# Patient Record
Sex: Male | Born: 1995 | Race: Black or African American | Hispanic: No | Marital: Single | Smoking: Never smoker
Health system: Southern US, Community
[De-identification: ages and names within clinical notes are randomized; demographics above are authoritative.]

## PROBLEM LIST (undated history)

## (undated) DIAGNOSIS — I82409 Acute embolism and thrombosis of unspecified deep veins of unspecified lower extremity: Secondary | ICD-10-CM

## (undated) DIAGNOSIS — Z86718 Personal history of other venous thrombosis and embolism: Secondary | ICD-10-CM

## (undated) HISTORY — PX: SHOULDER SURGERY: SHX246

## (undated) HISTORY — PX: ANTERIOR CRUCIATE LIGAMENT REPAIR: SHX115

---

## 2021-01-18 ENCOUNTER — Ambulatory Visit (INDEPENDENT_AMBULATORY_CARE_PROVIDER_SITE_OTHER): Payer: Worker's Compensation

## 2021-01-18 ENCOUNTER — Other Ambulatory Visit: Payer: Self-pay

## 2021-01-18 ENCOUNTER — Encounter: Payer: Self-pay | Admitting: Emergency Medicine

## 2021-01-18 ENCOUNTER — Ambulatory Visit
Admission: EM | Admit: 2021-01-18 | Discharge: 2021-01-18 | Disposition: A | Discharge status: Home / Self Care | Payer: Worker's Compensation | Attending: Family Medicine | Admitting: Family Medicine

## 2021-01-18 DIAGNOSIS — M7918 Myalgia, other site: Secondary | ICD-10-CM

## 2021-01-18 DIAGNOSIS — M79645 Pain in left finger(s): Secondary | ICD-10-CM | POA: Diagnosis not present

## 2021-01-18 HISTORY — DX: Personal history of other venous thrombosis and embolism: Z86.718

## 2021-01-18 MED ORDER — MELOXICAM 15 MG PO TABS: 15.0000 mg | ORAL_TABLET | Freq: Every day | ORAL | 0 refills | Status: DC | PRN

## 2021-01-18 MED ORDER — TRIAMCINOLONE ACETONIDE 0.5 % EX OINT: 1.0000 "application " | TOPICAL_OINTMENT | Freq: Two times a day (BID) | CUTANEOUS | 0 refills | Status: DC
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
## 2021-01-18 NOTE — Discharge Instructions (Signed)
Xray negative.  Medication as needed.  Everything looks good.  Take care  Dr. Adriana Simas
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
## 2021-04-03 ENCOUNTER — Encounter: Payer: Self-pay | Admitting: Emergency Medicine

## 2021-04-03 ENCOUNTER — Other Ambulatory Visit: Payer: Self-pay

## 2021-04-03 ENCOUNTER — Ambulatory Visit
Admission: EM | Admit: 2021-04-03 | Discharge: 2021-04-03 | Disposition: A | Discharge status: Home / Self Care | Payer: BC Managed Care – PPO

## 2021-04-03 DIAGNOSIS — M62838 Other muscle spasm: Secondary | ICD-10-CM | POA: Diagnosis not present

## 2021-04-03 HISTORY — DX: Acute embolism and thrombosis of unspecified deep veins of unspecified lower extremity: I82.409

## 2021-04-03 MED ORDER — DIAZEPAM 5 MG PO TABS: 5.0000 mg | ORAL_TABLET | Freq: Every evening | ORAL | 0 refills | Status: AC

## 2021-04-03 MED ORDER — IBUPROFEN 600 MG PO TABS: 600.0000 mg | ORAL_TABLET | Freq: Four times a day (QID) | ORAL | 0 refills | Status: AC | PRN

## 2021-04-03 MED ORDER — METHOCARBAMOL 500 MG PO TABS: 500.0000 mg | ORAL_TABLET | Freq: Two times a day (BID) | ORAL | 0 refills | Status: AC

## 2021-04-03 NOTE — ED Provider Notes (Signed)
[facility]    CSN: [id number] Arrival date & time: 04/03/21  1226      History   Chief Complaint Chief Complaint  Patient presents with  . Neck Pain    HPI Cody Robles is a 25 y.o. male.   HPI   25 year old male here for evaluation of neck pain.  Patient reports that he developed pain 2 days ago while in the shower and he was shaking his head back-and-forth.  He states that he felt a pull in his neck.  This pulled then later got worse after he slept on the loveseat with his girlfriend.  He has been using moist heat and Tylenol with minimal relief.  He denies numbness, tingling, weakness in his extremities and he denies injury.  Past Medical History:  Diagnosis Date  . DVT (deep venous thrombosis) (HCC)   . Hx of blood clots     There are no problems to display for this patient.   Past Surgical History:  Procedure Laterality Date  . ANTERIOR CRUCIATE LIGAMENT REPAIR    . SHOULDER SURGERY Left        Home Medications    Prior to Admission medications   Medication Sig Start Date End Date Taking? Authorizing Provider  aspirin EC 81 MG tablet Take 81 mg by mouth daily. Swallow whole.   Yes [provider]  diazepam (VALIUM) 5 MG tablet Take 1 tablet (5 mg total) by mouth at bedtime for 5 days. 04/03/21 04/08/21 Yes Becky Augusta, NP  ibuprofen (ADVIL) 600 MG tablet Take 1 tablet (600 mg total) by mouth every 6 (six) hours as needed. 04/03/21  Yes Becky Augusta, NP  methocarbamol (ROBAXIN) 500 MG tablet Take 1 tablet (500 mg total) by mouth 2 (two) times daily. 04/03/21  Yes Becky Augusta, NP    Family History History reviewed. No pertinent family history.  Social History Social History   Tobacco Use  . Smoking status: Never Smoker  . Smokeless tobacco: Never Used  Vaping Use  . Vaping Use: Never used  Substance Use Topics  . Alcohol use: Never  . Drug use: Yes    Types: Marijuana     Allergies   Patient has no known  allergies.   Review of Systems Review of Systems  Constitutional: Negative for activity change, appetite change and fever.  Musculoskeletal: Positive for myalgias and neck pain. Negative for neck stiffness.  Skin: Negative for color change.  Neurological: Negative for weakness.  Hematological: Negative.   Psychiatric/Behavioral: Negative.      Physical Exam Triage Vital Signs ED Triage Vitals  Enc Vitals Group     BP 04/03/21 1257 103/72     Pulse Rate 04/03/21 1257 72     Resp 04/03/21 1257 18     Temp 04/03/21 1257 98.9 F (37.2 C)     Temp Source 04/03/21 1257 Oral     SpO2 04/03/21 1257 99 %     Weight 04/03/21 1255 (!) 300 lb 0.7 oz (136.1 kg)     Height 04/03/21 1255 6' (1.829 m)     Head Circumference --      Peak Flow --      Pain Score 04/03/21 1254 6     Pain Loc --      Pain Edu? --      Excl. in GC? --    No data found.  Updated Vital Signs BP 103/72 (BP Location: Left Arm)   Pulse 72   Temp 98.9 F (  37.2 C) (Oral)   Resp 18   Ht 6' (1.829 m)   Wt (!) 300 lb 0.7 oz (136.1 kg)   SpO2 99%   BMI 40.69 kg/m   Visual Acuity Right Eye Distance:   Left Eye Distance:   Bilateral Distance:    Right Eye Near:   Left Eye Near:    Bilateral Near:     Physical Exam Vitals and nursing note reviewed.  Constitutional:      General: He is not in acute distress.    Appearance: Normal appearance. He is obese. He is not ill-appearing.  HENT:     Head: Normocephalic and atraumatic.  Musculoskeletal:        General: Tenderness present. No swelling or signs of injury. Normal range of motion.  Skin:    General: Skin is warm and dry.     Capillary Refill: Capillary refill takes less than 2 seconds.     Findings: No bruising or erythema.  Neurological:     General: No focal deficit present.     Mental Status: He is alert and oriented to person, place, and time.  Psychiatric:        Mood and Affect: Mood normal.        Behavior: Behavior normal.         Thought Content: Thought content normal.        Judgment: Judgment normal.      UC Treatments / Results  Labs (all labs ordered are listed, but only abnormal results are displayed) Labs Reviewed - No data to display  EKG   Radiology No results found.  Procedures Procedures (including critical care time)  Medications Ordered in UC Medications - No data to display  Initial Impression / Assessment and Plan / UC Course  I have reviewed the triage vital signs and the nursing notes.  Pertinent labs & imaging results that were available during my care of the patient were reviewed by me and considered in my medical decision making (see chart for details).   Is a very pleasant 25 year old male here for evaluation of neck pain that is been going for last 2 days.  Patient reports that the pain is where his right side of his neck meets his shoulder and is greater on the right side than the left side.  Patient states that he does not have any injury that he can remember that is causes but he initially felt some pulling when he was shaking his head in the shower.  Patient denies numbness, tingling, or weakness in his extremities.  Physical exam reveals no midline cervical spinous tenderness.  Patient does have left and right paraspinous tenderness and spasm which is more significant on the right than the left.  Patient's upper extremity strength is 5/5 bilaterally and bilateral grips are 5/5.  Patient has approximately 45 degrees of lateral rotation in both directions in his neck as well as 30 degrees of lateral flexion.  Patient can extend his neck and look at the ceiling but causes pain when he tries to put his chin to his chest.  We will treat patient for cervicalgia with ibuprofen, methocarbamol, and diazepam for bedtime spasm relief.   Final Clinical Impressions(s) / UC Diagnoses   Final diagnoses:  Trapezius muscle spasm     Discharge Instructions     Take the ibuprofen, 600 mg every  6 hours with food, for pain and inflammation.  Take this on a schedule for the next 2 days and then  as needed.  Take the methocarbamol, 1000 mg every 6 hours with food, for muscle spasm schedule for next 2 days and then as needed.  Use the diazepam at bedtime only as it make you drowsy but will help with the muscle spasm.  Follow the neck range of motion exercises given at discharge.  Consider getting a massage to help with relief of muscle tension and pain as well.    ED Prescriptions    Medication Sig Dispense Auth. Provider   ibuprofen (ADVIL) 600 MG tablet Take 1 tablet (600 mg total) by mouth every 6 (six) hours as needed. 30 tablet Becky Augusta, NP   methocarbamol (ROBAXIN) 500 MG tablet Take 1 tablet (500 mg total) by mouth 2 (two) times daily. 20 tablet Becky Augusta, NP   diazepam (VALIUM) 5 MG tablet Take 1 tablet (5 mg total) by mouth at bedtime for 5 days. 5 tablet Becky Augusta, NP     I have reviewed the PDMP during this encounter.   Becky Augusta, NP 04/03/21 1320

## 2021-04-03 NOTE — ED Triage Notes (Signed)
Pt c/o neck pain. Started about 2 days ago. He states he was whipping his hair back and forth while in the shower and felt it pull. Then he slept on a love seat and made it worse. Pt states it is hard to turn his head back and forth.

## 2021-04-03 NOTE — Discharge Instructions (Addendum)
Take the ibuprofen, 600 mg every 6 hours with food, for pain and inflammation.  Take this on a schedule for the next 2 days and then as needed.  Take the methocarbamol, 1000 mg every 6 hours with food, for muscle spasm schedule for next 2 days and then as needed.  Use the diazepam at bedtime only as it make you drowsy but will help with the muscle spasm.  Follow the neck range of motion exercises given at discharge.  Consider getting a massage to help with relief of muscle tension and pain as well.
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
